# Patient Record
Sex: Female | Born: 1960
Health system: Western US, Academic
[De-identification: ages and names within clinical notes are randomized; demographics above are authoritative.]

---

## 2018-05-04 ENCOUNTER — Ambulatory Visit

## 2018-05-04 DIAGNOSIS — M5127 Other intervertebral disc displacement, lumbosacral region: Secondary | ICD-10-CM

## 2018-05-04 DIAGNOSIS — M5116 Intervertebral disc disorders with radiculopathy, lumbar region: Secondary | ICD-10-CM

## 2018-05-04 DIAGNOSIS — M9933 Osseous stenosis of neural canal of lumbar region: Secondary | ICD-10-CM

## 2018-05-04 DIAGNOSIS — M4726 Other spondylosis with radiculopathy, lumbar region: Secondary | ICD-10-CM

## 2018-05-04 NOTE — Progress Notes
Donnelly Stager, MD.  Donne Hazel, PA    The patient was seen in the [place] office.    The patient's office visit was accompanied and assisted by a Spanish-speaking interpreter.        SUBJECTIVE COMPLAINTS:  Vicki Cortez is a 57 year old female who returns to the office today for routine follow-up and reevaluation of her low back pain radiating to bilateral groins secondary to known, established mild lumbar spondylosis at L5-S1.     She returns to the office today reporting significant improvement in her condition in the interim since her last office visit.  She contributes this to chiropractic care in decompression.  She reports that her pain has decreased from a 7/10 at her last visit to now a 2/10.  She continues the cyclobenzaprine as needed.  Overall her condition is much improved.  She continues to have occasional lower lumbar soreness but no more debilitating pain.  She no longer has any radicular complaints of leg pain, numbness, tingling  or weakness.    PHYSICAL EXAMINATION:  GENERAL: She is a well-nourished, well-developed female.  She is alert and oriented. Standing upright posture is with shoulder and pelvis level. Respirations are normal.  Gait is normal.    MUSCULOSKELETAL/LUMBAR SPINE: Lumbar integumentary is warm, dry and pink without any erythema, ecchymosis or edema.  No tenderness is elicited on exam today.  Sitting straight leg raise is negative bilaterally. Bilateral lower extremity integumentary is warm, dry and pink without any erythema, ecchymosis, edema, atrophy or fasciculations noted. There are no motor or sensory deficits upon exam today.    RADIOGRAPHS: No new plain radiographs were taken in office today.    DIAGNOSIS:  1. Axial lumbar back pain  2. Occasional bilateral lumbar radiculitis  3. Central protrusion L3-4   4. 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 Mild lumbar spondylosis L5-S1, moderate spondylosis with osseous stenosis at L4-5 RECOMMENDATIONS: At this time will continue with observation.      FOLLOWUP:  We will see the patient in 2-3 months time for reevaluation if needed.  All of the patient's questions and concerns were addressed and answered.    Sincerely,    The patient was examined and evaluated by Donne Hazel, PA for Donnelly Stager, MD.  The evaluation and plan was reviewed and approved by Donnelly Stager, MD    Donne Hazel, PA-C    Donnelly Stager, M.D.  Orthopedic Surgeon        cc: Dr. Macarthur Critchley, MD

## 2018-05-18 ENCOUNTER — Ambulatory Visit

## 2018-05-19 MED ORDER — CELECOXIB 200 MG PO CAPS
200 mg | ORAL_CAPSULE | Freq: Every day | ORAL | 3 refills | Status: AC
Start: 2018-05-19 — End: ?

## 2018-07-10 ENCOUNTER — Ambulatory Visit: Payer: BLUE CROSS/BLUE SHIELD

## 2018-09-21 ENCOUNTER — Ambulatory Visit: Payer: BLUE CROSS/BLUE SHIELD

## 2018-09-21 NOTE — Progress Notes
DATE: 09/21/2018   NAME: Vicki Cortez   MRN: [id number]   DOB: January 10, 1961   AGE: 58 y.o.       Donnelly Stager, M.D.   Donne Hazel, PA-C.       The patient was seen at the [facility] office.    The patient's office visit was accompanied and assisted by a Spanish-speaking interpreter.     INTERIM HISTORY: Vicki Cortez returns for a follow up and reevaluation of her low back pain radiating to bilateral groins secondary to known, established mild lumbar spondylosis at L5-S1.       PHYSICAL EXAMINATION:    GENERAL: Alert, oriented, in no acute distress     PSYCH: Normal affect and mood    EENT: External exam of the eyes, ears, nose and mouth reveals no deformities, scars or lesions    PULMONARY: Respirations are regular and unlabored. Respiratory effort is normal with no evidence of intercostal retraction, or excessive use of accessory muscles    MUSCULOSKELETAL/LUMBAR: Standing upright posture is with shoulders and pelvis level. Lumbar integumentary is warm, dry and pink without any erythema, ecchymosis or edema. Range of motion: Forward flexion and lateral side bending is without reciprocation of pain. Toe and heal walking is performable without any weakness or reciprocation of leg or back pain.  No tenderness is elicited upon palpation.  Sitting straight leg raise is negative to bilateral lower extremities.  There are no motor or sensory deficits upon exam today. Trendelenburg test is negative.      Gait upon entrance and exit of the exam room is normal without a limp and unassisted by any ambulatory aid.      RADIOGRAPHS: No new radiographic studies were obtained today. Previous images were reviewed with the patient.      DIAGNOSIS:   1. Axial lumbar back pain  2. Occasional bilateral lumbar radiculitis  3. Central protrusion L3-4   4. 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 Mild lumbar spondylosis L5-S1, moderate spondylosis with osseous stenosis at L4-5 RECOMMENDATIONS: An extensive amount of time was taken discussing diagnosis and treatment options with the patient.      All of the patient???s questions and concerns were addressed and answered.      Follow up: The patient will follow up ***       Sincerely,     The patient was examined and evaluated by Donne Hazel, PA for Donnelly Stager, MD.  The evaluation and plan was reviewed and approved by Donnelly Stager, MD    Donne Hazel, PA-C     Donnelly Stager, M.D.  Orthopedic Surgeon         Cc: Melene Muller., MD

## 2018-11-02 ENCOUNTER — Ambulatory Visit: Payer: BLUE CROSS/BLUE SHIELD

## 2018-11-02 DIAGNOSIS — M4726 Other spondylosis with radiculopathy, lumbar region: Secondary | ICD-10-CM

## 2018-11-02 DIAGNOSIS — M5116 Intervertebral disc disorders with radiculopathy, lumbar region: Secondary | ICD-10-CM

## 2018-11-02 DIAGNOSIS — M4316 Spondylolisthesis, lumbar region: Secondary | ICD-10-CM

## 2018-11-02 DIAGNOSIS — M545 Low back pain: Secondary | ICD-10-CM

## 2018-11-02 MED ORDER — CELECOXIB 200 MG PO CAPS
200 mg | ORAL_CAPSULE | Freq: Every day | ORAL | 0 refills | Status: AC
Start: 2018-11-02 — End: 2019-02-16

## 2018-11-02 NOTE — Progress Notes
Donnelly Stager, MD.  Donne Hazel, PA  ???  The patient was seen in the [place] office.  ???  The patient's office visit was accompanied and assisted by a Spanish-speaking interpreter.                SUBJECTIVE COMPLAINTS:  Vicki Cortez is a 58 year old female who returns to the office today for routine follow-up and reevaluation of her low back pain radiating to bilateral groins secondary to known, established mild lumbar spondylosis at L5-S1.   ???  She returns to the office today reporting occasional soreness midline at the level of her iliac crest and inferior to the hips and buttocks.  She otherwise is without complaints and reports that for the most part her condition is manageable and tolerable.  She denies any further distal radicular complaints of leg pain, numbness, tingling weakness.  ???  PHYSICAL EXAMINATION:  GENERAL: She is a well-nourished, well-developed female.  She is alert and oriented. Standing upright posture is with shoulder and pelvis level. Respirations are normal.  Gait is normal.  ???  MUSCULOSKELETAL/LUMBAR SPINE: Lumbar integumentary is warm, dry and pink without any erythema, ecchymosis or edema.  No tenderness is elicited on exam today.  Sitting straight leg raise is negative bilaterally. Bilateral lower extremity integumentary is warm, dry and pink without any erythema, ecchymosis, edema, atrophy or fasciculations noted. There are no motor or sensory deficits upon exam today.  ???  RADIOGRAPHS: Today in office I ordered 2 views of the lumbar spine standing, which were taken, reviewed and interpreted by myself from an orthopedic standpoint in the office today with the patient and reviewed and compared with previous radiographs taken 13 months ago January 2019 revealing progression and worsening. There is normal alignment on AP and lateral view with normal lumbar lordotic contour. Disc heights are well maintained without collapse or narrowing. There is further progressed now grade 1 near grade 2 spondylolisthesis of L4 on L5.  There are level iliac crests with unremarkable sacroiliac and hip joints.    DIAGNOSIS:  1. Axial lumbar back pain  2. Occasional bilateral lumbar radiculitis  3. Central protrusion L3-4   4. 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 Mild lumbar spondylosis L5-S1, moderate spondylosis with osseous stenosis at L4-5  6. Further progressed near grade 2 spondylolisthesis of L4 on L5  ???  RECOMMENDATIONS: Radiographs are reviewed as stated above. At this time will continue with observation as her condition is manageable and tolerable.      Medical management is initiated in office today.  A prescription for celecoxib 200 mg is given in office today.  ???  FOLLOWUP:  We will see the patient on an as-needed basis per her request.  All of the patient's questions and concerns were addressed and answered.  ???  Sincerely,  ???  The patient was examined and evaluated by Donne Hazel, PA for Donnelly Stager, MD.  The evaluation and plan was reviewed and approved by Donnelly Stager, MD    Donne Hazel, PA-C  ???  Donnelly Stager, M.D.  Orthopedic Surgeon  ???  ???  ???  cc: Dr. Macarthur Critchley, MD

## 2018-12-11 NOTE — Progress Notes
DATE: 12/13/2018   NAME: Vicki Cortez   MRN: [id number]   DOB: 05-12-1961   AGE: 58 y.o.     TODD A. SHAPIRO M.D.    CHIEF COMPLAINT: Right shoulder     REFERRING PHYSICIAN: Melene Muller., MD    HISTORY PRESENT ILLNESS: The patient is a 58 y.o. female who presents to the office for an evaluation of her right shoulder. ***    The patient reports sharp pain in the right shoulder rated from a 5-7/10 in severity. Associated symptoms of swelling and popping are noted.    PREVIOUS MEDICAL HISTORY:   Past Medical History:   Diagnosis Date   ??? Asthma    ??? GERD (gastroesophageal reflux disease)        PREVIOUS SURGICAL HISTORY:   Past Surgical History:   Procedure Laterality Date   ??? CESAREAN SECTION     ??? GALLBLADDER SURGERY         MEDICATIONS:   Current Outpatient Medications:   ???  atenolol 25 mg tablet, TAKE 1/2 TAB ORAL EVERY DAY, Disp: , Rfl: 3  ???  atorvastatin 20 mg tablet, TAKE 1 TABLET (20 MG) BY MOUTH DAILY, Disp: , Rfl: 4  ???  pantoprazole 40 mg DR tablet, , Disp: , Rfl:   ???  celecoxib 200 mg capsule, Take 1 capsule (200 mg total) by mouth daily., Disp: 90 capsule, Rfl: 0  ???  cyclobenzaprine 5 mg tablet, TAKE 1 TABLET BY MOUTH EVERY 12 HOURS AS NEEDED, Disp: , Rfl: 2  ???  estradiol (ESTRACE) 0.1 mg/g vaginal cream, APPLY TO AFFECTED AREA AS DIRECTED, Disp: , Rfl: 0  ???  meloxicam 7.5 mg tablet,  See Instructions, 30 Tab, 5, TAKE 1 TABLET BY MOUTH TWICE A DAY, Soft Stop, Route to Pharmacy Electronically, [facility] [phone], Disp: , Rfl:   ???  omeprazole 20 mg DR capsule, TAKE ONE CAPSULE BY MOUTH TWICE A DAY, Disp: , Rfl: 3    ALLERGIES: No Known Allergies    SOCIAL HISTORY:   Social History     Tobacco Use   ??? Smoking status: Never Smoker   ??? Smokeless tobacco: Never Used   Substance Use Topics   ??? Alcohol use: No   ??? Drug use: No       FAMILY HISTORY:   Family History   Problem Relation Age of Onset   ??? Cancer Mother        REVIEW OF SYSTEMS: The 14-point review of systems as documented today in the medical record is remarkable for the positive orthopedic problems discussed above and their relevance was considered with respect to Constitutional, ENT, Cardiovascular, GU, Skin, Neurologic, Endocrine, Hematologic, Psychiatric, Gastrointestinal, Respiratory, Eyes and Allergic/Immunologic systems. Pertinent positives include: joint pain, joint stiffness, weakness of muscles or joints, back pain, cold extremities, bad general health, palpations, skin becoming drier, seasonal cough, and wears glasses.     EXAMINATION:  Ht 5' 3'' (1.6 m)  ~ Wt 136 lb (61.7 kg)  ~ BMI 24.09 kg/m???     GENERAL: Alert, oriented, in no acute distress     PSYCH: Normal affect and mood    EENT: External exam of the eyes, ears, nose and mouth reveals no deformities, scars or lesions    PULMONARY: Respirations are regular and unlabored. Respiratory effort is normal with no evidence of intercostal retraction, or excessive use of accessory muscles    MUSCULOSKELATAL:    Right Shoulder:  Inspection:  [x]  Normal   []  Surgical scars []   Traumatic scars []  Arthroscopic scars   []  Symmetric shoulders []  Asymmetric shoulders []  Biceps retracted   []  Swelling  []  Bruising []  AC joint deformity  []  Deltoid atrophy []  Supraspinatus fossa atrophy        []  Infraspinatus atrophy      Palpation: [x]   No tenderness []  Diffuse tenderness to palpation  []  AC Joint [] SC joint []  Subacromial space   []  Anterior shoulder []  Posterior shoulder        []  Biceps tendon   []  Trapezius   []  Scapula/Periscapular []  Warmth   []  Bogginess           []  Fluctuance                 []  AC joint ballottable        Range of motion exam:   Forward elevation  *** ? Pain []  Present [x]  Absent     Abduction *** ?  Pain []  Present [x]  Absent     External rotation at side *** ?  Pain []  Present [x]  Absent     Internal rotation  *** at spinal level  Pain []  Present [x]  Absent    Crepitus is         []  Present [x]  Absent    External rotation @ 90?:  Not tested. Internal rotation @ 90?: Not tested.  GIRD: Not tested.     Scapular Thoracic Motion:    [x]  Normal                      []  Medial winging                 []  Elevation with ROM   []  Asymmetric         Strength: []  Not tested secondary to pain  []  Not tested.   External rotation at side:  []  3/5 []  4-/5 [] 4/5 []  4+/5  []  5/5 []  with pain   Deltoid:  []  3/5 []  4-/5 [] 4/5 []  4+/5  []  5/5 []  with pain  Subscap (Belly Press): []  3/5 []  4-/5 [] 4/5 []  4+/5  []  5/5 []  with pain    []  Drop arm                 []  External Rotation Lag          Provocative Tests:   []  PROVOCATIVE NOT TESTED DUE TO PAIN    Impingement:  Neer [] Positive [] Negative  [x] Not Tested  Hawkins [] Positive [] Negative  [x] Not Tested  Cross arm Adduction [] Positive [] Negative  [x] Not Tested   Speeds    [] Positive [] Negative  [x] Not Tested     Instability/Labrum:  DLS    [] Positive [] Negative  [x] Not Tested  Active compression [] Positive [] Negative  [x] Not Tested      Load shift: [] Ant  [] Post [] Positive [] Negative  [x] Not Tested   Posterior stress [] Positive [] Negative  [x] Not Tested  Sulcus    [] Positive [] Negative  [x] Not Tested   Apprehension/Relocation [] Positive [] Negative  [x] Not Tested  Anterior/posterior drawer:  []  1+ []  2+ []  3+ []  Not Tested      NEUROLOGIC: No gross motor or sensory deficits     VASCULAR: Radial pulses are intact    SKIN: No rashes or lesions    X-Rays: Radiographs of the right shoulder 4 views, including AP, axillary outlet and acromioclavicular joint views were ordered, performed and reviewed in clinic today. They show ***    MRI: MRI of the  right shoulder without contrast performed at [facility] on April 09, 2017 was reviewed in clinic today. Study shows:      IMPRESSION: ***    MEDICAL DECISION MAKING: An extensive amount of time was spent reviewing radiographs, discussing diagnosis and treatment options with the patient.      All of the patient???s questions and concerns were addressed. FOLLOW UP: The patient will follow up ***       Sincerely,       _____________________   Shawnie Dapper. Nile Riggs, M.D.  Sports Medicine/Shoulder and Elbow Reconstruction  Orthopedic Surgery      Cc: Melene Muller., MD

## 2018-12-13 ENCOUNTER — Ambulatory Visit: Payer: BLUE CROSS/BLUE SHIELD | Attending: Sports Medicine

## 2018-12-13 DIAGNOSIS — M25511 Pain in right shoulder: Secondary | ICD-10-CM

## 2018-12-13 DIAGNOSIS — M75121 Complete rotator cuff tear or rupture of right shoulder, not specified as traumatic: Secondary | ICD-10-CM

## 2019-01-17 ENCOUNTER — Ambulatory Visit: Payer: BLUE CROSS/BLUE SHIELD | Attending: Sports Medicine

## 2019-01-17 DIAGNOSIS — M75121 Complete rotator cuff tear or rupture of right shoulder, not specified as traumatic: Secondary | ICD-10-CM

## 2019-01-17 NOTE — Progress Notes
DATE: 01/17/2019   NAME: Vicki Cortez   MRN: [id number]   DOB: [birth year]   AGE: 58 y.o.     TODD A. SHAPIRO, MD    Reason for visit: Follow up right shoulder.    INTERIM HISTORY:  The patient returns today for follow up and re-evaluation of her right shoulder.  She has completed an MRI which we will review in the office today.    ROS:  A COVID-19 questionnaire was filled out by the patient including a positive COVID-19 diagnosis in the last 14 days, contact with anybody diagnosed with  COVID-19 in the last 14 days, fever, headaches, muscle pain, weakness, and diarrhea nausea vomiting abdominal pain, respiratory illness/cough, shortness of breath, loss of smell, loss of taste, rash skin irritation, unexplained hemorrhage, and fatigue. The responses were negative. Temperature was taken and it was less than 100 F.      PHYSICAL EXAMINATION:   General Exam: The patient is awake, alert, no apparent distress.  Psych Exam: the patient has a normal mood and affect.    MUSCULOSKELATAL:    Right Shoulder:  Inspection:  [x]  Normal   []  Surgical scars []  Traumatic scars []  Arthroscopic scars   []  Symmetric shoulders []  Asymmetric shoulders []  Biceps retracted   []  Swelling  []  Bruising []  AC joint deformity  []  Deltoid atrophy []  Supraspinatus fossa atrophy        []  Infraspinatus atrophy      Palpation: [x]   No tenderness []  Diffuse tenderness to palpation  []  AC Joint [] SC joint []  Subacromial space   []  Anterior shoulder []  Posterior shoulder        []  Biceps tendon   []  Trapezius   []  Scapula/Periscapular []  Warmth   []  Bogginess           []  Fluctuance                 []  AC joint ballottable      Range of motion exam:   Forward elevation  *** ? Pain []  Present [x]  Absent     Abduction *** ?  Pain []  Present [x]  Absent     External rotation at side *** ?  Pain []  Present [x]  Absent     Internal rotation  *** at spinal level  Pain []  Present [x]  Absent    Crepitus is         []  Present [x]  Absent External rotation @ 90?:  Not tested.   Internal rotation @ 90?: Not tested.  GIRD: Not tested.     Scapular Thoracic Motion:    [x]  Normal                      []  Medial winging                 []  Elevation with ROM   []  Asymmetric         Strength: []  Not tested secondary to pain  []  Not tested.   External rotation at side:  []  3/5 []  4-/5 [] 4/5 []  4+/5  []  5/5 []  with pain   Deltoid:  []  3/5 []  4-/5 [] 4/5 []  4+/5  []  5/5 []  with pain  Subscap (Belly Press): []  3/5 []  4-/5 [] 4/5 []  4+/5  []  5/5 []  with pain    []  Drop arm                 []  External Rotation Lag  Provocative Tests:   []  PROVOCATIVE NOT TESTED DUE TO PAIN    Impingement:  Neer [] Positive [] Negative  [x] Not Tested  Hawkins [] Positive [] Negative  [x] Not Tested  Cross arm Adduction [] Positive [] Negative  [x] Not Tested   Speeds    [] Positive [] Negative  [x] Not Tested     Instability/Labrum:  DLS:    [] Positive [] Negative  [x] Not Tested     Active compression [] Positive [] Negative  [x] Not Tested      Load shift: [] Ant  [] Post [] Positive [] Negative  [x] Not Tested   Posterior stress [] Positive [] Negative  [x] Not Tested  Sulcus    [] Positive [] Negative  [x] Not Tested  Apprehension/Relocation [] Positive [] Negative  [x] Not Tested  Anterior/posterior drawer:  []  1+ []  2+ []  3+ []  Not Tested      Neurologic exam reveals no gross motor or sensory deficits.    Vascular exam reveals an easily palpable radial pulse and a well perfused extremity.    MRI      IMPRESSION: ***       PLAN: An extensive amount of time was spent discussing diagnosis and treatment options with the patient.     ***      All of the patient???s questions and concerns were addressed.    FOLLOW UP: The patient will follow up ***       Sincerely,       _____________________   Shawnie Dapper. Nile Riggs, M.D.  Sports Medicine/Shoulder and Elbow Reconstruction  Orthopedic Surgery      CC: Melene Muller., MD

## 2019-02-15 MED ORDER — CELECOXIB 200 MG PO CAPS
ORAL_CAPSULE | 0 refills | Status: AC
Start: 2019-02-15 — End: 2019-05-11

## 2019-04-12 NOTE — Progress Notes
DATE: 04/18/2019   NAME: Vicki Cortez   MRN: [id number]   DOB: [birth year]   AGE: 58 y.o.     Jamion Carter A. Ona Rathert, MD    Reason for visit: Follow up right shoulder.    INTERIM HISTORY:  The patient returns today for follow up and re-evaluation of her right shoulder. We are currently treating her conservatively for right shoulder full-thickness rotator cuff tendon tear is the patient has elected to treat her injury non operatively.  She reports that the shoulder feels better she has no issues.    ROS: A COVID-19 questionnaire was filled out by the patient prior to the visit that included questions about having had a positive COVID-19 diagnosis in the last 14 days, contact with anybody diagnosed with  COVID-19 in the last 14 days, fever, headaches, unexplained muscle pain, weakness, diarrhea, nausea, vomiting, abdominal pain, respiratory illness/cough, shortness of breath, loss of smell, loss of taste, rash, skin irritation, unexplained hemorrhage and fatigue. The responses to those questions were negative. A temperature was taken and it was less than 100 F.    PHYSICAL EXAMINATION:   General Exam: The patient is awake, alert, no apparent distress.  Psych Exam: the patient has a normal mood and affect.    MUSCULOSKELATAL:    Right Shoulder:  Inspection:  [x]  Normal   []  Surgical scars []  Traumatic scars []  Arthroscopic scars   []  Symmetric shoulders []  Asymmetric shoulders []  Biceps retracted   []  Swelling  []  Bruising []  AC joint deformity  []  Deltoid atrophy []  Supraspinatus fossa atrophy        []  Infraspinatus atrophy      Palpation: [x]   No tenderness []  Diffuse tenderness to palpation  []  AC Joint [] SC joint []  Subacromial space   []  Anterior shoulder []  Posterior shoulder        []  Biceps tendon   []  Trapezius   []  Scapula/Periscapular []  Warmth   []  Bogginess           []  Fluctuance                 []  AC joint ballottable      Range of motion exam:   Forward elevation  160 ? Pain []  Present [x]  Absent Abduction 160 ?  Pain []  Present [x]  Absent     External rotation at side 60 ?  Pain []  Present [x]  Absent     Internal rotation  T10 at spinal level  Pain []  Present [x]  Absent    Crepitus is         []  Present [x]  Absent    External rotation @ 90?:  Not tested.   Internal rotation @ 90?: Not tested.  GIRD: Not tested.     Scapular Thoracic Motion:    [x]  Normal                      []  Medial winging                 []  Elevation with ROM   []  Asymmetric         Strength: []  Not tested secondary to pain  []  Not tested.   External rotation at side:  []  3/5 []  4-/5 [x] 4/5 []  4+/5  []  5/5 []  with pain   Deltoid:  []  3/5 []  4-/5 [] 4/5 []  4+/5  []  5/5 []  with pain  Subscap (Belly Press): []  3/5 []  4-/5 [] 4/5 []  4+/5  [x]  5/5 []  with  pain    []  Drop arm                 []  External Rotation Lag          Provocative Tests:   []  PROVOCATIVE NOT TESTED DUE TO PAIN    Impingement:  Neer [] Positive [] Negative  [x] Not Tested  Hawkins [] Positive [] Negative  [x] Not Tested  Cross arm Adduction [] Positive [] Negative  [x] Not Tested   Speeds    [] Positive [] Negative  [x] Not Tested     Instability/Labrum:  DLS:    [] Positive [] Negative  [x] Not Tested     Active compression [] Positive [] Negative  [x] Not Tested      Load shift: [] Ant  [] Post [] Positive [] Negative  [x] Not Tested   Posterior stress [] Positive [] Negative  [x] Not Tested  Sulcus    [] Positive [] Negative  [x] Not Tested  Apprehension/Relocation [] Positive [] Negative  [x] Not Tested  Anterior/posterior drawer:  []  1+ []  2+ []  3+ []  Not Tested      Neurologic exam reveals no gross motor or sensory deficits.    Vascular exam reveals an easily palpable radial pulse and a well perfused extremity.    IMPRESSION: Right shoulder full-thickness rotator cuff tendon tear       PLAN:   She continues to have minimal symptoms from her right shoulder rotator cuff tendon tear.  She has a fairly large rotator cuff tendon tear.  My recommendation would be for rotator cuff repair despite having minimal symptoms in the shoulder as the natural history is that the tear will progress in size and become a irreparable.  At that point on the salvage procedures could be undertaken including superior capsular reconstruction or reverse shoulder replacement.      All of the patient???s questions and concerns were addressed.    FOLLOW UP: The patient will follow up prn       Sincerely,       _____________________   Shawnie Dapper. Nile Riggs, M.D.  Sports Medicine/Shoulder and Elbow Reconstruction  Orthopedic Surgery      CC: Melene Muller., MD

## 2019-04-18 ENCOUNTER — Ambulatory Visit: Payer: BLUE CROSS/BLUE SHIELD | Attending: Sports Medicine

## 2019-04-18 DIAGNOSIS — M75121 Complete rotator cuff tear or rupture of right shoulder, not specified as traumatic: Secondary | ICD-10-CM

## 2019-05-11 MED ORDER — CELECOXIB 200 MG PO CAPS
ORAL_CAPSULE | 2 refills | Status: AC
Start: 2019-05-11 — End: ?

## 2019-07-12 ENCOUNTER — Ambulatory Visit: Payer: BLUE CROSS/BLUE SHIELD

## 2019-07-12 DIAGNOSIS — M9933 Osseous stenosis of neural canal of lumbar region: Secondary | ICD-10-CM

## 2019-07-12 DIAGNOSIS — M4726 Other spondylosis with radiculopathy, lumbar region: Secondary | ICD-10-CM

## 2019-07-12 DIAGNOSIS — M4316 Spondylolisthesis, lumbar region: Secondary | ICD-10-CM

## 2019-07-12 DIAGNOSIS — R52 Pain, unspecified: Secondary | ICD-10-CM

## 2019-07-12 NOTE — Progress Notes
Donnelly Stager, MD.  Donne Hazel, PA  ?  The patient was seen in the [place] office.  ?  The patient's office visit was accompanied and assisted by a Spanish-speaking interpreter.??  ?  SUBJECTIVE COMPLAINTS: ?Mrs. Vicki Cortez is a 40?year old female who returns to the office today for routine follow-up and reevaluation of her low back pain radiating to bilateral groins secondary to known, established mild lumbar spondylosis at L5-S1.   ?  She returns to the office today reporting occasional soreness midline at the level of her iliac crest and inferior to the hips and buttocks.  She otherwise is without complaints and reports that for the most part her condition is manageable and tolerable.  She denies any further distal radicular complaints of leg pain, numbness, tingling weakness.  ?  PHYSICAL EXAMINATION:  GENERAL: She is a well-nourished, well-developed female. ?She is alert and oriented. Standing upright posture is with shoulder and pelvis level. Respirations are normal. ?Gait is normal.  ?  MUSCULOSKELETAL/LUMBAR SPINE: Lumbar integumentary is warm, dry and pink without any erythema, ecchymosis or edema.??No tenderness is elicited on exam today. ?Sitting straight leg raise is negative bilaterally. Bilateral lower extremity integumentary is warm, dry and pink without any erythema, ecchymosis, edema, atrophy or fasciculations noted. There are no motor or sensory deficits upon exam today.  ?  RADIOGRAPHS: Today in office I ordered 2 views of the lumbar spine standing, which were taken, reviewed and interpreted by myself from an orthopedic standpoint in the office today with the patient and reviewed and compared with previous radiographs taken 13 months ago January 2019 revealing progression and worsening. There is normal alignment on AP and lateral view with normal lumbar lordotic contour. Disc heights are well maintained without collapse or narrowing. There is further progressed now grade 1 near grade 2 spondylolisthesis of L4 on L5.  There are level iliac crests with unremarkable sacroiliac and hip joints.  ?  DIAGNOSIS:  1. Axial lumbar back pain  2. Occasional bilateral lumbar radiculitis  3. Central protrusion L3-4   4. 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 Mild lumbar spondylosis L5-S1, moderate spondylosis with osseous stenosis at L4-5  6. Further progressed near grade 2 spondylolisthesis of L4 on L5  ?  RECOMMENDATIONS:?Radiographs are reviewed as stated above. At?this time will continue with observation as her condition is manageable and tolerable. ?  ?  Medical management is initiated in office today.  A prescription for celecoxib 200 mg is given in office today.  ?  FOLLOWUP:??We will see the patient on an as-needed basis per her request. ?All of the patient's questions and concerns were addressed and answered.  ?  Sincerely,  ?  The patient was examined and evaluated by Donne Hazel, PA for Donnelly Stager, MD. ?The evaluation and plan was reviewed and approved by Donnelly Stager, MD    Donne Hazel, PA-C  ?  Donnelly Stager, M.D.  Orthopedic Surgeon  ?  ?  ?  cc: Dr. Macarthur Critchley, MD

## 2019-07-12 NOTE — Patient Instructions
[facility] after [facility]

## 2019-08-13 ENCOUNTER — Ambulatory Visit: Payer: BLUE CROSS/BLUE SHIELD

## 2019-09-27 ENCOUNTER — Ambulatory Visit: Payer: BLUE CROSS/BLUE SHIELD

## 2019-09-27 DIAGNOSIS — M9933 Osseous stenosis of neural canal of lumbar region: Secondary | ICD-10-CM

## 2019-09-27 DIAGNOSIS — R52 Pain, unspecified: Secondary | ICD-10-CM

## 2019-09-27 DIAGNOSIS — M4726 Other spondylosis with radiculopathy, lumbar region: Secondary | ICD-10-CM

## 2019-09-27 DIAGNOSIS — M4316 Spondylolisthesis, lumbar region: Secondary | ICD-10-CM

## 2019-09-27 NOTE — Progress Notes
DATE: 09/27/2019   NAME: Vicki Cortez   MRN: [id number]   DOB: 1961/05/31   AGE: 59 y.o.   ?  ?  Donnelly Stager, MD.  Donne Hazel, PA  ?  The patient was seen in the [place] office.  ?  SUBJECTIVE COMPLAINTS: ?Vicki Cortez is a 59?year old female who returns to the office today for routine follow-up and reevaluation of her low back pain radiating to bilateral groins secondary to known, established spondylolisthesis of L4 on L5 with lumbar disc herniation.   ?  She returns to the office today?reporting  no change or improvement in her condition in the interim since her last office visit.  She reports  this is despite administration of a diagnostic and therapeutic epidural steroid injection at L4-5 about 1 month ago by pain management specialist Dr. Lurena Nida.  She reports tolerating the injection well denying any fevers, chills or headaches.  She reports the injection did not help at all not even day.  She continues to localize pain midline at the level of her iliac crest and inferior to the hips and buttocks. ?She reports leg pain, numbness and tingling radiating to the right greater than left L5 dermatome.  Her complaints are a 6+/10 in severity.  She reports a followup with Dr. Jenne Campus early next week.    ROS:  A COVID-19 questionnaire was filled out by the patient including contact with anybody diagnosed with COVID-19 in the last 30 days, fever, headaches, muscle pain, weakness, and diarrhea nausea vomiting abdominal pain, respiratory illness/cough, shortness of breath, loss of smell, loss of taste, rash skin irritation, unexplained hemorrhage, and fatigue.  Responses were negative.  ?  PHYSICAL EXAMINATION:  VITAL SIGNS: The patient's temperature was taken twice upon entrance to the office building today given COVID-19 screening; the patient is afebrile. GENERAL: She is a well-nourished, well-developed female. ?She is alert and oriented. Standing upright posture is with shoulder and pelvis level. Respirations are normal. ?Gait is normal.  ?  MUSCULOSKELETAL/LUMBAR SPINE: Lumbar integumentary is warm, dry and pink without any erythema, ecchymosis or edema.??Tenderness is elicited on exam today at the belt line and inferior to the lumbosacral junctions. ?Sitting straight leg raise is negative bilaterally. Bilateral lower extremity integumentary is warm, dry and pink without any erythema, ecchymosis, edema, atrophy or fasciculations noted. There are no motor or sensory deficits upon exam today.  ?  RADIOGRAPHS:?No new radiographs were taken in office today.  ?  DIAGNOSIS:  1. Axial lumbar back pain  2. Occasional bilateral lumbar radiculitis  3. Central protrusion L3-4   4. Broad-based disc herniation L4-5 with annular tear?  5. Mild?lumbar spondylosis L5-S1, moderate spondylosis with osseous stenosis at L4-5  6. Further progressed near grade 2 spondylolisthesis of L4 on L5  ?  RECOMMENDATIONS:? At this time will proceed with observation.  She will follow-up as scheduled early next week per her request with pain management specialist.    It is discussed of a 2nd diagnostic and therapeutic epidural steroid injection.  She wishes to consider this treatment option.  ?  FOLLOWUP: We will see the patient per her request after she sees her pain management specialist next week or is reassured to telephone our office as far as proceeding with the next step of her care.  All of the patient's questions and concerns were addressed and answered.  ?  ?  Sincerely,  ?  The patient was examined and evaluated by Donne Hazel, PA for Donnelly Stager, MD. ?The evaluation  and plan was reviewed and approved by Donnelly Stager, MD    Donne Hazel, PA-C  ?  Donnelly Stager, M.D.  Orthopedic Surgeon  ?  ?  ?  cc: Dr. Macarthur Critchley, MD      Dr. Lurena Nida, MD

## 2019-12-24 ENCOUNTER — Ambulatory Visit: Payer: BLUE CROSS/BLUE SHIELD

## 2019-12-24 DIAGNOSIS — Z23 Encounter for immunization: Secondary | ICD-10-CM

## 2021-10-21 ENCOUNTER — Ambulatory Visit: Payer: BLUE CROSS/BLUE SHIELD | Attending: Sports Medicine

## 2021-11-11 ENCOUNTER — Ambulatory Visit: Payer: BLUE CROSS/BLUE SHIELD | Attending: Sports Medicine

## 2021-11-11 DIAGNOSIS — M75121 Complete rotator cuff tear or rupture of right shoulder, not specified as traumatic: Secondary | ICD-10-CM

## 2021-11-11 DIAGNOSIS — M25511 Pain in right shoulder: Secondary | ICD-10-CM

## 2021-11-11 NOTE — Progress Notes
DATE: 11/11/2021   NAME: Vicki Cortez   MRN: [id number]   DOB: 1960/10/19   AGE: 61 y.o.       Rohen Kimes A. Nile Riggs, MD    CHIEF COMPLAINT:   Chief Complaint   Patient presents with   ? Right Shoulder - Pain     REFERRING PHYSICIAN: Melene Muller., MD    HISTORY PRESENT ILLNESS: Vicki Cortez is a 61 y.o. female who presents to the office for an evaluation of her right shoulder.  She was last seen in July 2022.  She was diagnosed with the rotator cuff tear the she elected to treat non operatively.  She returns today with complaints of right shoulder pain which have worsened.  She notes pain which radiates from the shoulder down the arm.  She has pain associated with sleeping and lying down in bed.    PREVIOUS MEDICAL HISTORY:   Past Medical History:   Diagnosis Date   ? Asthma    ? GERD (gastroesophageal reflux disease)    ? Hypercholesterolemia    ? Palpitations      PREVIOUS SURGICAL HISTORY:   Past Surgical History:   Procedure Laterality Date   ? CESAREAN SECTION     ? GALLBLADDER SURGERY       MEDICATIONS:   Current Outpatient Medications:   ?  atenolol 25 mg tablet, TAKE 1/2 TAB ORAL EVERY DAY, Disp: , Rfl: 3  ?  atorvastatin 20 mg tablet, TAKE 1 TABLET (20 MG) BY MOUTH DAILY, Disp: , Rfl: 4  ?  CELECOXIB 200 mg capsule, TAKE 1 CAPSULE BY MOUTH EVERY DAY, Disp: 30 capsule, Rfl: 2  ?  cyclobenzaprine 5 mg tablet, TAKE 1 TABLET BY MOUTH EVERY 12 HOURS AS NEEDED, Disp: , Rfl: 2  ?  estradiol (ESTRACE) 0.1 mg/g vaginal cream, APPLY TO AFFECTED AREA AS DIRECTED, Disp: , Rfl: 0  ?  meloxicam 7.5 mg tablet,  See Instructions, 30 Tab, 5, TAKE 1 TABLET BY MOUTH TWICE A DAY, Soft Stop, Route to Pharmacy Electronically, [facility] [phone], Disp: , Rfl:   ?  omeprazole 20 mg DR capsule, TAKE ONE CAPSULE BY MOUTH TWICE A DAY, Disp: , Rfl: 3  ?  pantoprazole 40 mg DR tablet, , Disp: , Rfl:     ALLERGIES: No Known Allergies    SOCIAL HISTORY:   Social History     Tobacco Use   ? Smoking status: Never   ? Smokeless tobacco: Never Substance Use Topics   ? Alcohol use: No   ? Drug use: No     FAMILY HISTORY:   Family History   Problem Relation Age of Onset   ? Cancer Mother      REVIEW OF SYSTEMS: The 14-point review of systems as documented today in the medical record is remarkable for the positive orthopedic problems discussed above and their relevance was considered with respect to Constitutional, ENT, Cardiovascular, GU, Skin, Neurologic, Endocrine, Hematologic, Psychiatric, Gastrointestinal, Respiratory, Eyes and Allergic/Immunologic systems.    Pertinent positives include: Back pain, Palpitations, Increased cholesterol, Frequent urination, 3 pregnancies and 0 deliveries.    EXAMINATION:  Ht 5' 3'' (1.6 m)  ~ Wt 140 lb (63.5 kg)  ~ BMI 24.80 kg/m?     GENERAL: Alert, oriented, in no acute distress     PSYCH: Normal affect and mood    EENT: External exam of the eyes, ears, nose and mouth reveals no deformities, scars or lesions    PULMONARY: Respirations are regular and unlabored. Respiratory  effort is normal with no evidence of intercostal retraction, or excessive use of accessory muscles    MUSCULOSKELETAL:    Right Shoulder:    Inspection:  [x]  Normal   []  Surgical scars []  Traumatic scars []  Arthroscopic scars   []  Symmetric shoulders []  Asymmetric shoulders []  Biceps retracted   []  Swelling  []  Bruising []  AC joint deformity  []  Deltoid atrophy []  Infraspinatus atrophy       []  Supraspinatus fossa atrophy      Palpation:  [x]   No tenderness []  Diffuse tenderness to palpation  []  AC Joint [] SC joint []  Subacromial space   []  Anterior shoulder []  Posterior shoulder []  Biceps tendon   []  Trapezius   []  Scapula/Periscapular []  Warmth   []  Bogginess           []  Fluctuance []  AC joint ballottable      Range of Motion:   Forward elevation: 160 ? Pain []  Present [x]  Absent     Abduction: 160 ?  Pain []  Present [x]  Absent     External rotation at side: 60 ?  Pain []  Present [x]  Absent     Internal rotation: T10 at spinal level  Pain []  Present [x]  Absent    Crepitus is         []  Present [x]  Absent    External rotation @ 90?:  Not tested.   Internal rotation @ 90?: Not tested.  GIRD: Not tested.     Scapular Thoracic Motion:    [x]  Normal                      []  Medial winging                 []  Elevation with ROM   []  Asymmetric         Strength:  []  Not tested secondary to pain  []  Not tested.   External rotation at side: []  3/5      []  4-/5      [x] 4/5      []  4+/5      []  5/5      []  with pain   Deltoid:  []  3/5      []  4-/5      [] 4/5      []  4+/5      [x]  5/5      []  with pain  Subscap (Belly Press): []  3/5      []  4-/5      [] 4/5      []  4+/5      [x]  5/5      []  with pain    []  Drop arm                 []  External Rotation Lag          Provocative Tests:   []  PROVOCATIVE NOT TESTED DUE TO PAIN    Impingement:  Neer [] Positive [] Negative  [x] Not Tested  Hawkins [] Positive [] Negative  [x] Not Tested  Cross arm Adduction [] Positive [] Negative  [x] Not Tested   Speeds    [] Positive [] Negative  [x] Not Tested     Instability/Labrum:  Active compression [] Positive [] Negative  [x] Not Tested      Load shift:  [] Ant  [] Post [] Positive [] Negative  [x] Not Tested   Posterior stress [] Positive [] Negative  [x] Not Tested  Sulcus    [] Positive [] Negative  [x] Not Tested  Apprehension/Relocation [] Positive [] Negative  [x] Not Tested  Anterior/posterior drawer:  []  1+ []   2+ []  3+ [x]  Not Tested    NEUROLOGIC: No gross motor or sensory deficits    VASCULAR: Radial pulses are intact    SKIN: No rashes or lesions    RADIOGRAPHS: Radiographs of the right shoulder, 4 views including AP, grashey, outlet, and axillary views were ordered, performed, and reviewed in clinic today. They show no fractures or dislocations.  Glenohumeral joint space is well maintained.    MRI: An MRI study of the right shoulder without contrast performed at [facility] on December 16, 2018 was reviewed in clinic today. Study shows:  1.  High-grade partial tear anterior infraspinatus insertion, may include full-thickness component, measures 1.0 x 0.8 cm with mild interstitial tearing extending medially. This also involves the posterior leading fibers of the  supraspinatus tendon. This is mildly progressed when compared to MR from 04/09/2017.  2.  Unchanged mild degenerative signal within the superior labrum.  3.  Moderate acromioclavicular degenerative joint disease with mild lateral downsloping of the distal acromion.  4.  Small joint effusion with a moderate amount of fluid extending into the subacromial/subdeltoid bursa.    IMPRESSION:  1. Right shoulder rotator cuff tendon tear    MEDICAL DECISION MAKING:  I reviewed the x-rays and examined the patient.  She has weakness consistent with rotator cuff tendon tearing.  My recommendation would be to get an updated MRI of the shoulder.  The patient is willing to consider surgery at this point.  All of the patient?s questions and concerns were addressed.     FOLLOW UP: The patient will follow up after the MR        Sincerely,       _____________________   Shawnie Dapper. Nile Riggs, M.D.  Sports Medicine/Shoulder and Elbow Reconstruction  Orthopedic Surgery        Cc: Melene Muller., MD

## 2021-12-31 ENCOUNTER — Ambulatory Visit: Payer: BLUE CROSS/BLUE SHIELD | Attending: Surgical

## 2021-12-31 DIAGNOSIS — M75121 Complete rotator cuff tear or rupture of right shoulder, not specified as traumatic: Secondary | ICD-10-CM

## 2021-12-31 NOTE — Progress Notes
DATE: 12/31/2021   NAME: Vicki Cortez   MRN: [id number]   DOB: 29-Apr-1961   AGE: 61 y.o.       REASON FOR VISIT: Follow-up right shoulder    INTERIM HISTORY: Vicki Cortez returns today for reevaluation of the right shoulder.  The patient has had right shoulder pain for several years.  Surgery was recommended in July 2020 for a high-grade rotator cuff tendon tear.  The patient elected to treat the shoulder non operatively at that time.  She notes worsening symptoms.  She has pain that radiates down the arm.  She has trouble sleeping and laying flat in bed.  She has completed an MRI and comes today for the results.     EXAMINATION:   There were no vitals taken for this visit.     GENERAL: Alert, oriented, in no acute distress     PSYCH: Normal affect and mood    EENT: External exam of the eyes, ears, nose and mouth reveals no deformities, scars or lesions    PULMONARY: Respirations are regular and unlabored. Respiratory effort is normal with no evidence of intercostal retraction, or excessive use of accessory muscles    MUSCULOSKELETAL:  Right Shoulder:  ?  Inspection:  [x] ? Normal   [] ? Surgical scars                [] ? Traumatic scars             [] ? Arthroscopic scars   [] ? Symmetric shoulders     [] ? Asymmetric shoulders   [] ? Biceps retracted   [] ? Swelling                         [] ? Bruising                          [] ? AC joint deformity  [] ? Deltoid atrophy              [] ? Infraspinatus atrophy       [] ? Supraspinatus fossa atrophy    ?  Palpation:     [] ?  No tenderness   [] ? Diffuse tenderness to palpation  [] ? AC Joint                         [] ?SC joint                           [x] ? Subacromial space   [] ? Anterior shoulder           [] ? Posterior shoulder         [] ? Biceps tendon   [] ? Trapezius                       [] ? Scapula/Periscapular     [] ? Warmth   [] ? Bogginess                      [] ? Fluctuance                     [] ? AC joint ballottable    ?  Range of Motion:   Forward elevation: 160 ? Pain     [] ? Present [x] ? Absent  Abduction: 160 ?                               Pain     [] ? Present [x] ? Absent                    External rotation at side: 60 ?           Pain     [] ? Present [x] ? Absent                              Internal rotation: T10 at spinal level             Pain           [] ? Present     [x] ? Absent  ?  Crepitus is         [] ? Present              [x] ? Absent  ?  External rotation @ 90?:  Not tested.   Internal rotation @ 90?: Not tested.  GIRD: Not tested.   ?  Scapular Thoracic Motion:    [x] ? Normal                      [] ? Medial winging                 [] ? Elevation with ROM   [] ? Asymmetric       ?  Strength:      [] ? Not tested secondary to pain  [] ? Not tested.   External rotation at side:     [] ? 3/5      [] ? 4-/5      [x] ?4/5      [] ? 4+/5      [] ? 5/5      [] ? with pain   Deltoid:                               [] ? 3/5      [] ? 4-/5      [] ?4/5      [] ? 4+/5      [x] ? 5/5      [] ? with pain  Subscap (Belly Press):       [] ? 3/5      [] ? 4-/5      [] ?4/5      [] ? 4+/5      [x] ? 5/5      [] ? with pain  ?  [] ? Drop arm                 [] ? External Rotation Lag        ?  Provocative Tests:   [] ? PROVOCATIVE NOT TESTED DUE TO PAIN    Impingement:  Neer                                    [] ?Positive     [] ?Negative    [x] ?Not Tested  Hawkins                              [] ?Positive     [] ?Negative    [x] ?Not Tested  Cross arm Adduction          [] ?  Positive     [] ?Negative    [x] ?Not Tested             Speeds                                [] ?Positive     [] ?Negative    [x] ?Not Tested             ?  Instability/Labrum:  Active compression            [] ?Positive     [] ?Negative    [x] ?Not Tested      Load shift:  [] ?Ant  [] ?Post  [] ?Positive     [] ?Negative    [x] ?Not Tested             Posterior stress                   [] ?Positive     [] ?Negative    [x] ?Not Tested  Sulcus                                 [] ?Positive     [] ?Negative    [x] ?Not Tested  Apprehension/Relocation   [] ?Positive     [] ?Negative    [x] ?Not Tested  Anterior/posterior drawer:   [] ? 1+  [] ? 2+  [] ? 3+  [x] ? Not Tested  ?  NEUROLOGIC: No gross motor or sensory deficits  ?  VASCULAR: Radial pulses are intact  ?  SKIN: No rashes or lesions    RADIOGRAPHS:  No new images taken today.    MRI of the right shoulder completed at [facility] on December 04, 2021:       IMPRESSION:   Right shoulder full-thickness rotator cuff tendon tear    PLAN:  We discussed the diagnosis and treatment options.  We reviewed the most recent MRI.  She does have a full-thickness rotator cuff tendon tear which has progressed in comparison to previous MRIs.  Surgery is recommended for arthroscopy and rotator cuff tendon repair.     I have discussed the diagnosis and proposed procedure with the patient.  In lay terms, I have provided them with all the information necessary to make an informed decision.  In particular we discussed proposed benefit of the operation, as well as the significant risks.  We also discussed the available alternative treatments and risks of these alternatives.  I informed them the level of the severity of the injury and the prognosis for the recovery.  We also discussed the proposed plan for rehabilitation.  They understand that additional surgery may be necessary to solve this problem if they consent to the procedure.    She will follow-up with her primary care physician and cardiologist for appropriate clearance.    FOLLOW UP: The patient will follow up at her pre-op appointment, sooner as needed        The patient was examined and evaluated by Weldon Picking, PA-C for Hope Budds M.D. The evaluation and plan was reviewed and approved by Hope Budds M.D.      _________________________________  Weldon Picking, PA-C  Hope Budds M.D.  ORTHOPEDIC SURGERY        CC: Melene Muller., MD

## 2022-02-10 ENCOUNTER — Ambulatory Visit: Payer: BLUE CROSS/BLUE SHIELD | Attending: Surgical

## 2022-02-10 DIAGNOSIS — M75121 Complete rotator cuff tear or rupture of right shoulder, not specified as traumatic: Secondary | ICD-10-CM

## 2022-02-10 DIAGNOSIS — M25511 Pain in right shoulder: Secondary | ICD-10-CM

## 2022-02-10 NOTE — Progress Notes
PRE-OPERATIVE HISTORY AND PHYSICAL    02/10/2022     Date of Surgery:  February 18, 2022        Patient:   Vicki Cortez   Acct No: [id number]  DOB:  1960-11-16      Planned Surgery:  Right shoulder operative arthroscopy-debridement, rotator cuff tendon repair and biceps tenodesis    HISTORY OF PRESENT ILLNESS: Vicki Cortez is a 61 y.o. female who comes for pre-operative evaluation of her right shoulder.  The patient has had right shoulder pain for several years.  Surgery was recommended in July 2020 for a high-grade partial-thickness rotator cuff tendon tear.  The patient elected to treat her symptoms conservatively as she was managing her pain well at the time.  Her pain has progressed recently.  She has pain that radiates down the arm.  She has limitations with reaching and lifting and trouble sleeping.  She has elected to proceed with surgery due to persistent pain.    PREVIOUS MEDICAL HISTORY:   Past Medical History:   Diagnosis Date   ? Asthma    ? GERD (gastroesophageal reflux disease)    ? Hypercholesterolemia    ? Palpitations        PREVIOUS SURGICAL HISTORY:   Past Surgical History:   Procedure Laterality Date   ? CESAREAN SECTION     ? GALLBLADDER SURGERY         MEDICATIONS:   Current Outpatient Medications:   ?  omeprazole 20 mg DR capsule,  1 Cap, PO, BID, Qty: 60 Cap, Refills: 3, Maintenance, Route to Pharmacy Electronically, [facility] [phone], Disp: , Rfl:   ?  atenolol 25 mg tablet, TAKE 1/2 TAB ORAL EVERY DAY, Disp: , Rfl: 3  ?  atorvastatin 20 mg tablet, TAKE 1 TABLET (20 MG) BY MOUTH DAILY, Disp: , Rfl: 4     ALLERGIES: No Known Allergies    SOCIAL HISTORY:   Social History     Tobacco Use   ? Smoking status: Never   ? Smokeless tobacco: Never   Substance Use Topics   ? Alcohol use: No   ? Drug use: No       FAMILY HISTORY:   Family History   Problem Relation Age of Onset   ? Cancer Mother        The patient's medical history, medications, and pertinent information have been reviewed and updated in the patient's electronic medical record.    REVIEW OF SYSTEMS: Positive for musculoskeletal. Other systems have been updated and reviewed in the patient's electronic medical record.      PHYSICAL EXAM: BP 110/64  ~ Pulse 63  ~ Ht 5' 3'' (1.6 m)  ~ Wt 140 lb (63.5 kg)  ~ SpO2 93%  ~ BMI 24.80 kg/m?     The patient is well developed, well nourished, and in no acute distress.  Body habitus is normal.    Patient is oriented x 3, to place, time and person. Mood is normal. Affect is normal.      External exam of eyes, ears, nose and mouth reveals no deformities, scars or lesions.      Respirations are regular and unlabored.  Respiratory effort is normal with no evidence of abnormal intercostal retraction, or excessive use of accessory muscles.      External chest is normal in appearance.      Skin appears to be normal without rashes, lesions, or ulcers.      Pulse is regular.  No cyanosis, clubbing or  edema is evident.      Patient has no movement disorder or loss of sensation except as described below.      Gait and station are within normal limits except as described below.  No amputations or fixed deformities are evident.      MUSCULOSKELETAL:   Right Shoulder:  ?  Inspection: ?[x] ???Normal   [] ???Surgical scars????????????????[] ???Traumatic scars?????????????[] ???Arthroscopic scars   [] ???Symmetric shoulders?????[] ???Asymmetric shoulders???[] ???Biceps retracted   [] ???Swelling ????????????????????????[] ???Bruising??????????????????????????[] ???AC joint deformity  [] ???Deltoid atrophy??????????????[] ???Infraspinatus atrophy ????  [] ???Supraspinatus fossa atrophy ?  ?  Palpation:?????[] ????No tenderness???[] ???Diffuse tenderness to palpation  [] ???AC Joint?????????????????????????[] ??SC joint???????????????????????????[x] ???Subacromial space   [] ???Anterior shoulder???????????[] ???Posterior shoulder?????????[] ???Biceps tendon   [] ???Trapezius ??????????????????????[] ???Scapula/Periscapular?????[] ???Warmth   [] ???Bogginess ?????????????????????[] ???Fluctuance?????????????????????[] ???AC joint ballottable ?  ?  Range of Motion:   Forward elevation:?160????????????????????Pain?????[] ???Present?[x] ???Absent????????????????????????????  Abduction:?160?????????????????????????????????Pain?????[] ???Present?[x] ???Absent??????????????????  External rotation at side:?60?????????????Pain?????[] ???Present?[x] ???Absent????????????????????????????  Internal rotation:?T10?at spinal level ????????????Pain???????????[] ???Present?????[x] ???Absent  ?  Crepitus is ????????[] ???Present??????????????[x] ???Absent  ?  External rotation @ 90?: ?Not tested.   Internal rotation @ 90?: Not tested.  GIRD: Not tested.   ?  Scapular Thoracic Motion:??  [x] ???Normal ?????????????????????[] ???Medial winging ????????????????[] ???Elevation with ROM   [] ???Asymmetric ????  ?  Strength:??????[] ???Not tested secondary to pain ?[] ???Not tested.   External rotation at side:?????[] ???3/5??????[] ???4-/5 ?????[x] ??4/5 ?????[] ???4+/5 ?????[] ???5/5 ?????[] ???with pain   Deltoid: ??????????????????????????????[] ???3/5??????[] ???4-/5 ?????[] ??4/5 ?????[] ???4+/5 ?????[x] ???5/5 ?????[] ???with pain  Subscap (Belly Press):???????[] ???3/5??????[] ???4-/5 ?????[] ??4/5 ?????[] ???4+/5 ?????[x] ???5/5 ?????[] ???with pain  ?  [] ???Drop arm ????????????????[] ???External Rotation Lag ?????  ?  Provocative Tests:   [] ???PROVOCATIVE NOT TESTED DUE TO PAIN??  Impingement:  Neer????????????????????????????????????[] ??Positive?????[] ??Negative ???[x] ??Not Tested  Hawkins??????????????????????????????[] ??Positive?????[] ??Negative ???[x] ??Not Tested  Cross arm Adduction??????????[] ??Positive?????[] ??Negative ???[x] ??Not Tested???????????  Speeds ???????????????????????????????[] ??Positive?????[] ??Negative ???[x] ??Not Tested???????????  ?  Instability/Labrum:  Active compression????????????[] ??Positive?????[] ??Negative ???[x] ??Not Tested ???  Load shift: ?[] ??Ant ?[] ??Post??[] ??Positive?????[] ??Negative ???[x] ??Not Tested???????????  Posterior stress???????????????????[] ??Positive?????[] ??Negative ???[x] ??Not Tested  Sulcus ????????????????????????????????[] ??Positive?????[] ??Negative ???[x] ??Not Tested  Apprehension/Relocation???[] ??Positive?????[] ??Negative ???[x] ??Not Tested  Anterior/posterior drawer: ??[] ???1+??[] ???2+??[] ???3+??[x] ???Not Tested  ?  NEUROLOGIC: No gross motor or sensory deficits  ?  VASCULAR: Radial pulses are intact  ?  SKIN: No rashes or lesions    IMAGING STUDIES:   RADIOGRAPHS: Radiographs of the right shoulder, 4 views including AP, grashey, outlet, and axillary views were completed in February 2023. They show no fractures or dislocations.  Glenohumeral joint space is well maintained.    MRI of the right shoulder completed at [facility] on December 04, 2021:     ?    IMPRESSION:   Right shoulder full-thickness rotator cuff tendon tear    PLAN:  Abduction sling and cold therapy unit ordered to be dispensed    1. Follow up visit scheduled.  2. Pre-operative laboratory work up.   3. Post-operative pain medications.  Percocet 5/325 milligrams prescribed. The Cures database was reviewed  4. Post-operative instructions given.    I have discussed the diagnosis and proposed procedure with the patient.  In lay terms, I have provided them with all the information necessary to make an informed decision.  In particular we discussed proposed benefit of the operation, as well as the significant risks.  We also discussed the available alternative treatments and risks of these alternatives.  I informed them the level of the severity of the injury and the prognosis for the recovery.  We also discussed the proposed plan for rehabilitation.  They understand that additional surgery may be necessary to solve this problem if they consent to the procedure.        The patient was examined and evaluated by Weldon Picking, PA-C for Hope Budds M.D. The evaluation and plan was reviewed and approved by Georgiann Mccoy.D  _________________________________  Weldon Picking, PA-C  Hope Budds M.D.  ORTHOPEDIC SURGERY          _________________________________  Shawnie Dapper. Nile Riggs, M.D.  Orthopedic Surgery  Sports Medicine/Shoulder and Elbow Reconstruction        [] No interval changes in the H & P        CC: Melene Muller, MD

## 2022-02-11 MED ORDER — OXYCODONE-ACETAMINOPHEN 5-325 MG PO TABS
ORAL_TABLET | 0 refills | Status: AC
Start: 2022-02-11 — End: 2022-02-16

## 2022-02-16 MED ORDER — OXYCODONE-ACETAMINOPHEN 5-325 MG PO TABS
ORAL_TABLET | 0 refills | Status: AC
Start: 2022-02-16 — End: 2022-02-25

## 2022-02-22 NOTE — Progress Notes
DATE: 02/24/2022   NAME: Vicki Cortez   MRN: [id number]   DOB: 1961-01-31   AGE: 61 y.o.       TODD A. SHAPIRO, MD    REASON FOR VISIT:  Post-Op visit #1.    INTERIM HISTORY:  Vicki Cortez returns for the first post-op visit status post right shoulder arthroscopy with rotator cuff repair and biceps tenodesis performed on February 18, 2022.  The patient is tolerating the pain well.  There are no symptoms of infection. ***    PHYSICAL EXAMINATION:    GENERAL: Alert, oriented, in no acute distress    PSYCH: Normal affect and mood    EENT: External exam of the eyes, ears, nose and mouth reveals no deformities, scars or lesions    PULMONARY: Respirations are regular and unlabored. Respiratory effort is normal with no evidence of intercostal retraction, or excessive use of accessory muscles    MUSCULOSKELETAL:  Right Shoulder:    Inspection: The patient is wearing the abduction sling.  Surgical incisions are healing nicely, steri-strips are intact.   There is no drainage or erythemia.  Mild swelling is present. Bruising is present    Palpation: No abnormal warmth or fluctuance    Range of Motion Exam: Not tested.       VASCULAR:  Radial pulse is 2+.      NEUROLOGIC:  There are no gross motor or sensory deficits in the upper extremity.    RADIOGRAPHS: Radiographs of the right shoulder, 3 views including AP, acromioclavicular joint, and outlet views were ordered, performed, and reviewed in clinic today. They show ***    IMPRESSION: Doing well following right shoulder arthroscopy with rotator cuff repair and biceps tenodesis    MEDICAL DECISION MAKING: Radiographs were reviewed as stated above.  The surgery was discussed in detail including surgical photos and any unexpected findings. The patient was instructed to continue use of ice, heat, and medications as necessary.  They are to remove steri-strips in 4-5 days.      The patient will:    []  Begin physical therapy per protocol.   []  Discontinue the sling.    []  Continue with the sling for comfort.   []  Work on [employer] exercises at home.   []  Medications were refilled.   []  Medications were not refilled.     All of the patient?s questions and concerns were addressed and answered.      FOLLOW UP: The patient will return in ***         Sincerely,       _____________________   Shawnie Dapper. Nile Riggs, M.D.  Sports Medicine/Shoulder and Elbow Reconstruction  Orthopedic Surgery        Cc: Melene Muller., MD

## 2022-02-24 ENCOUNTER — Ambulatory Visit: Payer: BLUE CROSS/BLUE SHIELD | Attending: Sports Medicine

## 2022-02-24 DIAGNOSIS — M75121 Complete rotator cuff tear or rupture of right shoulder, not specified as traumatic: Secondary | ICD-10-CM

## 2022-02-24 DIAGNOSIS — Z9889 Other specified postprocedural states: Secondary | ICD-10-CM

## 2022-02-24 DIAGNOSIS — M25511 Pain in right shoulder: Secondary | ICD-10-CM

## 2022-02-24 MED ORDER — OXYCODONE-ACETAMINOPHEN 5-325 MG PO TABS
ORAL_TABLET | 0 refills | Status: AC
Start: 2022-02-24 — End: ?

## 2022-02-25 ENCOUNTER — Telehealth: Payer: BLUE CROSS/BLUE SHIELD

## 2022-02-25 NOTE — Telephone Encounter
Spoke with patient regarding post op PT. She states that [facility] advised her they couldn't get her in until the end of the month. I faxed the referral to [facility] requesting to get the patient in sooner rather than later. Patient was instructed to call there office to schedule.

## 2022-04-21 NOTE — Progress Notes
DATE: 04/22/2022   NAME: Vicki Cortez   MRN: [id number]   DOB: [birth year]   AGE: 61 y.o.       REASON FOR VISIT:  Post-Op visit #2.     INTERIM HISTORY:  Vicki Cortez returns for the first post-op visit status post right shoulder arthroscopy with rotator cuff repair and biceps tenodesis performed on February 18, 2022.  The patient is tolerating the pain well.  There are no symptoms of infection.  ***    PHYSICAL EXAMINATION:    GENERAL: Alert, oriented, in no acute distress    PSYCH: Normal affect and mood    EENT: External exam of the eyes, ears, nose and mouth reveals no deformities, scars or lesions    PULMONARY: Respirations are regular and unlabored. Respiratory effort is normal with no evidence of intercostal retraction, or excessive use of accessory muscles    MUSCULOSKELETAL:  Right Shoulder:    Inspection: The patient is wearing the abduction sling.  Surgical incisions are healing nicely, steri-strips are intact.   There is no drainage or erythemia.  Mild swelling is present. Bruising is present    Palpation: No abnormal warmth or fluctuance    Range of Motion Exam: Not tested.       VASCULAR:  Radial pulse is 2+.      NEUROLOGIC:  There are no gross motor or sensory deficits in the upper extremity.    RADIOGRAPHS: No new images taken today.     IMPRESSION: Doing well following right shoulder arthroscopy with rotator cuff repair and biceps tenodesis    MEDICAL DECISION MAKING: ***    All of the patient?s questions and concerns were addressed and answered.      FOLLOW UP: The patient will return in ***        Sincerely,       _____________________   Shawnie Dapper. Nile Riggs, M.D.  Sports Medicine/Shoulder and Elbow Reconstruction  Orthopedic Surgery        Cc: Melene Muller., MD

## 2022-04-22 ENCOUNTER — Ambulatory Visit: Payer: BLUE CROSS/BLUE SHIELD | Attending: Surgical

## 2022-04-22 DIAGNOSIS — Z9889 Other specified postprocedural states: Secondary | ICD-10-CM

## 2022-06-22 NOTE — Progress Notes
DATE: 06/24/2022   NAME: Vicki Cortez   MRN: [id number]   DOB: 04-Sep-1961   AGE: 61 y.o.       REASON FOR VISIT:  Follow up right shoulder.     INTERIM HISTORY:  Vicki Cortez returns for a follow up and re-evaluation of the right shoulder. She is status post right shoulder arthroscopy with rotator cuff repair and biceps tenodesis performed on February 18, 2022.  She has been attending therapy.  She is managing her symptoms well.     PHYSICAL EXAMINATION:    GENERAL: Alert, oriented, in no acute distress    PSYCH: Normal affect and mood    EENT: External exam of the eyes, ears, nose and mouth reveals no deformities, scars or lesions    PULMONARY: Respirations are regular and unlabored. Respiratory effort is normal with no evidence of intercostal retraction, or excessive use of accessory muscles    MUSCULOSKELETAL:  Right Shoulder:    Inspection: Surgical incisions are healed    Palpation: No abnormal warmth or fluctuance    Range of motion exam:   Forward flexion  170?   Abduction 170?  External rotation:  40?   Internal rotation:  SIJ       VASCULAR:  Radial pulse is 2+.      NEUROLOGIC:  There are no gross motor or sensory deficits in the upper extremity.    RADIOGRAPHS: No new images taken today.     IMPRESSION:   Status post right shoulder arthroscopy with rotator cuff repair and biceps tenodesis    MEDICAL DECISION MAKING:  She is doing well.  She will continue with physical therapy and her home exercises.  She will continue with the strengthening exercises.    All of the patient?s questions and concerns were addressed and answered.      FOLLOW UP: The patient will return in 2 months        Sincerely,       _________________________________  Weldon Picking, PA-C  Hope Budds M.D.  ORTHOPEDIC SURGERY              Cc: Melene Muller., MD

## 2022-06-24 ENCOUNTER — Ambulatory Visit: Payer: BLUE CROSS/BLUE SHIELD | Attending: Surgical

## 2022-06-24 DIAGNOSIS — Z9889 Other specified postprocedural states: Secondary | ICD-10-CM

## 2022-08-20 NOTE — Progress Notes
DATE: 08/24/2022   NAME: Vicki Cortez   MRN: [id number]   DOB: 1961-03-27   AGE: 61 y.o.       REASON FOR VISIT:  Follow up right shoulder.     INTERIM HISTORY:  Vicki Cortez returns for a follow up and re-evaluation of the right shoulder. She is status post right shoulder arthroscopy with rotator cuff repair and biceps tenodesis performed on February 18, 2022.  She has been attending therapy. ***    PHYSICAL EXAMINATION:    GENERAL: Alert, oriented, in no acute distress    PSYCH: Normal affect and mood    EENT: External exam of the eyes, ears, nose and mouth reveals no deformities, scars or lesions    PULMONARY: Respirations are regular and unlabored. Respiratory effort is normal with no evidence of intercostal retraction, or excessive use of accessory muscles    MUSCULOSKELETAL:  Right Shoulder:    Inspection: Surgical incisions are healed    Palpation: No abnormal warmth or fluctuance    Range of motion exam:   Forward flexion  170?   Abduction 170?  External rotation:  40?   Internal rotation:  SIJ       VASCULAR:  Radial pulse is 2+.      NEUROLOGIC:  There are no gross motor or sensory deficits in the upper extremity.    RADIOGRAPHS: No new images taken today.     IMPRESSION:   Status post right shoulder arthroscopy with rotator cuff repair and biceps tenodesis    MEDICAL DECISION MAKING:  ***    All of the patient?s questions and concerns were addressed and answered.      FOLLOW UP: The patient will return in ***        Sincerely,       _________________________________  Weldon Picking, PA-C  Hope Budds M.D.  ORTHOPEDIC SURGERY              Cc: Melene Muller., MD

## 2022-08-24 ENCOUNTER — Ambulatory Visit: Payer: BLUE CROSS/BLUE SHIELD | Attending: Surgical

## 2022-08-24 DIAGNOSIS — Z9889 Other specified postprocedural states: Secondary | ICD-10-CM

## 2022-09-27 NOTE — Progress Notes
DATE: 09/28/2022   NAME: Vicki Cortez   MRN: [id number]   DOB: [birth year]   AGE: 62 y.o.       REASON FOR VISIT:  Follow up right shoulder.     INTERIM HISTORY:  Sinia Antosh returns for a follow up and re-evaluation of the right shoulder. She is status post right shoulder arthroscopy with rotator cuff repair and biceps tenodesis performed on February 18, 2022.  She finished therapy and is performing home exercises. She developed increased pain with her home exercises.  She returns today for re-evaluation.  She has increased pain and stiffness.    PHYSICAL EXAMINATION:    GENERAL: Alert, oriented, in no acute distress    PSYCH: Normal affect and mood    EENT: External exam of the eyes, ears, nose and mouth reveals no deformities, scars or lesions    PULMONARY: Respirations are regular and unlabored. Respiratory effort is normal with no evidence of intercostal retraction, or excessive use of accessory muscles    MUSCULOSKELETAL:  Right Shoulder:    Inspection: Surgical incisions are healed    Palpation: No abnormal warmth or fluctuance    Range of motion exam:   Forward flexion  150? with pain  Abduction 150? with pain  External rotation:  40?   Internal rotation:  T12     Strength: []  Not tested secondary to pain  []  Not tested.   External rotation at side:     []  3/5 []  4-/5 [] 4/5 []  4+/5 [x]  5-/5 [x]  with pain   Deltoid:                               []  3/5 []  4-/5 [] 4/5 []  4+/5 [x]  5-/5 [x]  with pain  Subscap (Belly Press):       []  3/5 []  4-/5 [] 4/5 []  4+/5 [x]  5/5 [x]  with pain      VASCULAR:  Radial pulse is 2+.      NEUROLOGIC:  There are no gross motor or sensory deficits in the upper extremity.    RADIOGRAPHS: Radiographs of the right shoulder, 4 views including AP, acromioclavicular joint, outlet, and axillary views were ordered, performed, and reviewed in clinic today. They show no fractures or dislocations      IMPRESSION:   Status post right shoulder arthroscopy with rotator cuff repair and biceps tenodesis    MEDICAL DECISION MAKING:    We discussed the diagnosis and treatment options.  She returns today with increased pain and restrictions in motion.  Her symptoms are worse on today's visit in comparison to her last appointment.  I have recommended a follow-up MRI to evaluate the tendon repair site.  We briefly discussed returning to therapy, injections, or even subsequent surgery.  I have encouraged her to continue with her home exercises in the meantime.  She will continue with ice, heat, and medication as needed    All of the patient?s questions and concerns were addressed and answered.      FOLLOW UP: The patient will return to the office with the MRI results of the right shoulder, sooner prn        Sincerely,       _________________________________  Weldon Picking, PA-C  Hope Budds M.D.  ORTHOPEDIC SURGERY              Cc: Melene Muller., MD

## 2022-09-28 ENCOUNTER — Ambulatory Visit: Payer: BLUE CROSS/BLUE SHIELD | Attending: Surgical

## 2022-09-28 DIAGNOSIS — Z9889 Other specified postprocedural states: Secondary | ICD-10-CM

## 2022-09-28 DIAGNOSIS — M25511 Pain in right shoulder: Secondary | ICD-10-CM

## 2022-09-28 DIAGNOSIS — G8929 Other chronic pain: Secondary | ICD-10-CM

## 2022-11-08 NOTE — Progress Notes
DATE: 11/09/2022   NAME: Vicki Cortez   MRN: [id number]   DOB: 01/03/1961   AGE: 62 y.o.       REASON FOR VISIT:  Follow up right shoulder.     INTERIM HISTORY:  Vicki Cortez returns for a follow up and re-evaluation of the right shoulder. She is status post right shoulder arthroscopy with rotator cuff repair and biceps tenodesis performed on February 18, 2022.  She finished therapy and is performing her home exercises.  She continues to feel her symptoms are improving.  She has increased pain with repetitive use.  The patient recently underwent an MRI and presents today to review the results.     PHYSICAL EXAMINATION:    GENERAL: Alert, oriented, in no acute distress    PSYCH: Normal affect and mood    EENT: External exam of the eyes, ears, nose and mouth reveals no deformities, scars or lesions    PULMONARY: Respirations are regular and unlabored. Respiratory effort is normal with no evidence of intercostal retraction, or excessive use of accessory muscles    MUSCULOSKELETAL:  Right Shoulder:    Inspection: Surgical incisions are healed    Palpation: No abnormal warmth or fluctuance    Range of motion exam:   Forward flexion  170? with pain  Abduction 170? with pain  External rotation:  40?   Internal rotation:  T12     Strength: []  Not tested secondary to pain  []  Not tested.   External rotation at side:     []  3/5 []  4-/5 [] 4/5 []  4+/5 [x]  5-/5 [x]  with pain   Deltoid:                               []  3/5 []  4-/5 [] 4/5 []  4+/5 [x]  5-/5 [x]  with pain  Subscap (Belly Press):       []  3/5 []  4-/5 [] 4/5 []  4+/5 [x]  5/5 [x]  with pain      VASCULAR:  Radial pulse is 2+.      NEUROLOGIC:  There are no gross motor or sensory deficits in the upper extremity.    RADIOGRAPHS: No new images taken today.    MRI: An MRI study of the right shoulder without contrast performed at [facility] on October 07, 2022 was reviewed in clinic today. Study shows:  1. Surgical changes of supraspinatus tendon repair, construct intact with moderate residual tendinosis.  2. Long head biceps tendon is intact within the bicipital groove, the intra-articular portion is not well visualized. Given  the truncation of the superior labrum, this probably represents changes from an intact biceps tenodesis.  3. Moderate subscapularis tendinosis with low-grade articular surface partial tear of superior insertion.  4. Moderate infraspinatus tendinosis.  5. Small joint effusion and small amount of subacromial/subdeltoid bursal fluid.    The images were reviewed in the office today.  There is small metallic artifact noted closer to the surgical neck from surgery.  This is not noted on x-rays    IMPRESSION:     Status post right shoulder arthroscopy with rotator cuff repair and biceps tenodesis    MEDICAL DECISION MAKING:   Overall the rotator cuff tendon remains intact.  I reviewed the MRI as well as the report and examined the patient.  Her range of motion has continued to improve.  Her symptoms are consistent with tendinopathy.  She would like to try an ultrasound-guided subacromial injection in hopes to alleviate  her symptoms.  I stressed the importance of continuation of her exercises in order to improve her strength and overall function.    She will continue with ice, heat, and medication as needed.    All of the patient?s questions and concerns were addressed and answered.      FOLLOW UP: The patient will return to the office in 2-3 months, sooner prn        Large Joint/Bursa Drain/Inject: R subacromial bursa    Date/Time: 11/09/2022 10:10 AM    Performed by: Maylon Cos., PA-C  Authorized by: Maylon Cos., PA-C    Consent Given by:  Patient  Timeout: prior to procedure the correct patient, procedure, and site was verified    Indications:  Pain  Location:  Shoulder  Site:  R subacromial bursa  Prep: patient was prepped and draped in usual sterile fashion    Approach:  Anterolateral  Guidance: ultrasound    Needle Size:  22 G  Medications:  40 mg triamcinolone acetonide 40 mg/mL; 5 mL lidocaine 1%  Patient tolerance:  Patient tolerated the procedure well with no immediate complications   PROCEDURE: Right shoulder injection    After adequate informed consent was obtained, a SonoSite M-Turbo Ultrasound System laptop unit with a variable frequency (5.0-10.0 MHz) real time imaging linear transducer was used to locate the right shoulder subacromial space. Using sterile technique a needle was guided into the subacromial space. The space was then injected with 5cc of 1% lidocaine, and 1cc of Kenalog 40 mg/ml. Care was taken to ensure the tendon was not injected. The needle was removed and the injection site was cleaned and dressed with a sterile dressing.    The patient tolerated the procedure well. No complications were encountered during the procedure.     The patient was instructed to rest and ice the shoulder as needed.      If any signs or symptoms of a reaction to the mediation or infection develop, the patient should call the office immediately.    Sincerely,       _________________________________  Weldon Picking, PA-C  Hope Budds M.D.  ORTHOPEDIC SURGERY      Cc: Melene Muller., MD

## 2022-11-09 ENCOUNTER — Ambulatory Visit: Payer: BLUE CROSS/BLUE SHIELD | Attending: Surgical

## 2022-11-09 DIAGNOSIS — Z9889 Other specified postprocedural states: Secondary | ICD-10-CM

## 2022-11-09 DIAGNOSIS — M25511 Pain in right shoulder: Secondary | ICD-10-CM

## 2022-11-09 MED ADMIN — TRIAMCINOLONE ACETONIDE 40 MG/ML IJ SUSP: INTRA_ARTICULAR | @ 20:00:00 | Stop: 2022-11-09

## 2022-11-09 MED ADMIN — LIDOCAINE HCL 1 % IJ SOLN: INTRA_ARTICULAR | @ 20:00:00 | Stop: 2022-11-09

## 2022-11-23 ENCOUNTER — Ambulatory Visit: Payer: BLUE CROSS/BLUE SHIELD | Attending: Surgical

## 2023-02-07 ENCOUNTER — Ambulatory Visit: Payer: BLUE CROSS/BLUE SHIELD | Attending: Surgical

## 2023-02-07 DIAGNOSIS — M25511 Pain in right shoulder: Secondary | ICD-10-CM

## 2023-02-07 DIAGNOSIS — Z9889 Other specified postprocedural states: Secondary | ICD-10-CM

## 2023-02-07 NOTE — Progress Notes
DATE: 02/07/2023   NAME: Vicki Cortez   MRN: [id number]   DOB: 1961/07/09   AGE: 62 y.o.       REASON FOR VISIT:  Follow up right shoulder.     INTERIM HISTORY:  Vicki Cortez returns for a follow up and re-evaluation of the right shoulder. She is status post right shoulder arthroscopy with rotator cuff repair and biceps tenodesis performed on February 18, 2022.  She finished therapy and is performing her home exercises. She received a subacromial injection November 09, 2022. She is overall better.       PHYSICAL EXAMINATION:    GENERAL: Alert, oriented, in no acute distress    PSYCH: Normal affect and mood    EENT: External exam of the eyes, ears, nose and mouth reveals no deformities, scars or lesions    PULMONARY: Respirations are regular and unlabored. Respiratory effort is normal with no evidence of intercostal retraction, or excessive use of accessory muscles    MUSCULOSKELETAL:  Right Shoulder:    Inspection: Surgical incisions are healed    Palpation: No abnormal warmth or fluctuance    Range of motion exam:   Forward flexion  170?   Abduction 170?   External rotation:  50?   Internal rotation:  T12     Strength: []  Not tested secondary to pain  []  Not tested.   External rotation at side:     []  3/5 []  4-/5 [] 4/5 []  4+/5 [x]  5-/5 []  with pain   Deltoid:                               []  3/5 []  4-/5 [] 4/5 []  4+/5 [x]  5/5 []  with pain  Subscap (Belly Press):       []  3/5 []  4-/5 [] 4/5 []  4+/5 [x]  5/5 []  with pain      VASCULAR:  Radial pulse is 2+.      NEUROLOGIC:  There are no gross motor or sensory deficits in the upper extremity.    RADIOGRAPHS: No new images taken today.    MRI: An MRI study of the right shoulder without contrast performed at [facility] on October 07, 2022 was reviewed in clinic today. Study shows:  1. Surgical changes of supraspinatus tendon repair, construct intact with moderate residual tendinosis.  2. Long head biceps tendon is intact within the bicipital groove, the intra-articular portion is not well visualized. Given  the truncation of the superior labrum, this probably represents changes from an intact biceps tenodesis.  3. Moderate subscapularis tendinosis with low-grade articular surface partial tear of superior insertion.  4. Moderate infraspinatus tendinosis.  5. Small joint effusion and small amount of subacromial/subdeltoid bursal fluid.    The images were reviewed in the office today.  There is small metallic artifact noted closer to the surgical neck from surgery.  This is not noted on x-rays    IMPRESSION:     Status post right shoulder arthroscopy with rotator cuff repair and biceps tenodesis    MEDICAL DECISION MAKING:  We discussed the diagnosis and treatment options.  She has well-preserved range of motion and strength.  She has no complaints of pain on today's visit.  She responded well to the subacromial injection.  I have encouraged her to continue with her home exercises.  She may progress activity as tolerated.  She will contact our office if she has any worsening symptoms.     She will continue  with ice, heat, and medication as needed.    All of the patient?s questions and concerns were addressed and answered.      FOLLOW UP: The patient will return to the office as needed           Sincerely,       _________________________________  Weldon Picking, PA-C  Hope Budds M.D.  ORTHOPEDIC SURGERY      Cc: Melene Muller., MD

## 2023-02-07 NOTE — Progress Notes
DATE: 02/08/2023   NAME: Vicki Cortez   MRN: [id number]   DOB: [birth year]   AGE: 62 y.o.       REASON FOR VISIT:  Follow up right shoulder.     INTERIM HISTORY:  Vicki Cortez returns for a follow up and re-evaluation of the right shoulder. She is status post right shoulder arthroscopy with rotator cuff repair and biceps tenodesis performed on February 18, 2022.  She finished therapy and is performing her home exercises. The patient received a right subacromial cortisone injection on November 09, 2022. The patient reports {Blank single:19197::''mild'',''moderate'',''significant'',''temporary'',''no'',''***''} relief to their symptoms following the subacromial cortisone injection. ***    PHYSICAL EXAMINATION:    GENERAL: Alert, oriented, in no acute distress    PSYCH: Normal affect and mood    EENT: External exam of the eyes, ears, nose and mouth reveals no deformities, scars or lesions    PULMONARY: Respirations are regular and unlabored. Respiratory effort is normal with no evidence of intercostal retraction, or excessive use of accessory muscles    MUSCULOSKELETAL:  Right Shoulder:    Inspection: Surgical incisions are healed    Palpation: No abnormal warmth or fluctuance    Range of motion exam:   Forward flexion  170? with pain  Abduction 170? with pain  External rotation:  40?   Internal rotation:  T12     Strength: []  Not tested secondary to pain  []  Not tested.   External rotation at side:     []  3/5 []  4-/5 [] 4/5 []  4+/5 [x]  5-/5 [x]  with pain   Deltoid:                               []  3/5 []  4-/5 [] 4/5 []  4+/5 [x]  5-/5 [x]  with pain  Subscap [facility]):       []  3/5 []  4-/5 [] 4/5 []  4+/5 [x]  5/5 [x]  with pain      VASCULAR:  Radial pulse is 2+.      NEUROLOGIC:  There are no gross motor or sensory deficits in the upper extremity.    RADIOGRAPHS: No new images taken today.    MRI: An MRI study of the right shoulder without contrast performed at [facility] on October 07, 2022 was reviewed in clinic today. Study shows:  1. Surgical changes of supraspinatus tendon repair, construct intact with moderate residual tendinosis.  2. Long head biceps tendon is intact within the bicipital groove, the intra-articular portion is not well visualized. Given  the truncation of the superior labrum, this probably represents changes from an intact biceps tenodesis.  3. Moderate subscapularis tendinosis with low-grade articular surface partial tear of superior insertion.  4. Moderate infraspinatus tendinosis.  5. Small joint effusion and small amount of subacromial/subdeltoid bursal fluid.    The images were reviewed in the office today.  There is small metallic artifact noted closer to the surgical neck from surgery.  This is not noted on x-rays    IMPRESSION:     Status post right shoulder arthroscopy with rotator cuff repair and biceps tenodesis    MEDICAL DECISION MAKING:   The patient reported {Blank single:19197::''no'',''minimal'',''mild'',''great'',''temporary'',''near complete'',''complete''} relief in her symptoms following the cortisone injection in the right shoulder. ***     She will continue with ice, heat, and medication as needed.    All of the patient?s questions and concerns were addressed and answered.      FOLLOW UP: The patient  will return to the office in ***, sooner prn        Sincerely,       _________________________________  Weldon Picking, PA-C  Hope Budds M.D.  ORTHOPEDIC SURGERY      Cc: Melene Muller., MD

## 2023-02-08 ENCOUNTER — Ambulatory Visit: Payer: BLUE CROSS/BLUE SHIELD | Attending: Surgical

## 2024-01-24 IMAGING — MR MRI KNEE LT WO CONTRAST
5 of 6 series · 33 of 40 positions shown · non-contrast
Comparison: None.

INDICATION: Pain in left knee. Per patient, lateral knee pain, states pulled hamstring/knee 2 months ago.
TECHNIQUE: Multiplanar, multiecho imaging of the left knee was performed, including T1-weighted and fluid sensitive sequences without intravenous contrast administration.

[Series 2: t2_axial_fs · axial · 4.0mm · 0.44mm/px · z∈[-41,+74]mm · 7 of 24 slices shown]
[im 1/24]
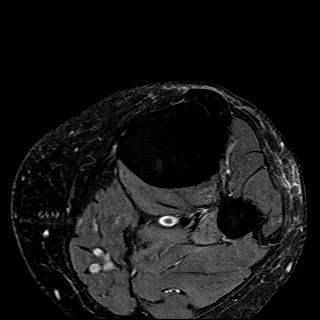
[im 4/24]
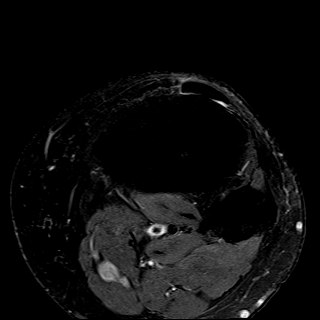
[im 8/24]
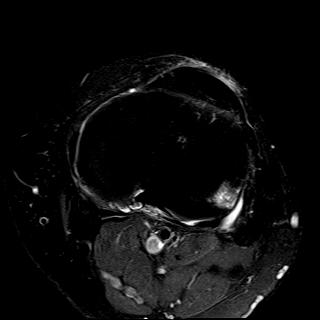
[im 12/24]
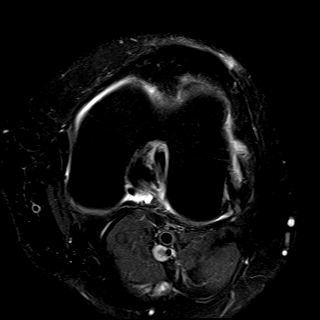
[im 16/24]
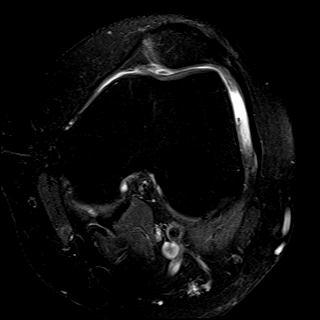
[im 20/24]
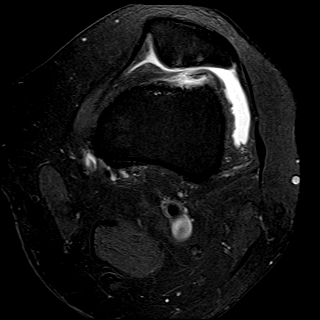
[im 24/24]
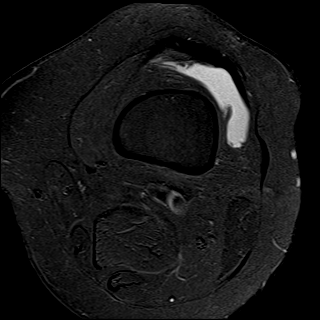

[Series 4: pd_sag_fs · sagittal · 3.0mm · 0.57mm/px · 7 of 25 slices shown]
[im 1/25]
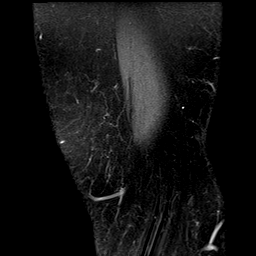
[im 5/25]
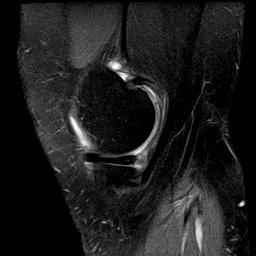
[im 9/25]
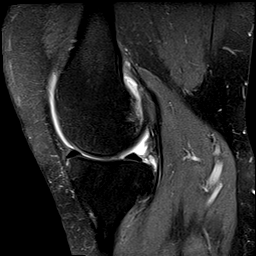
[im 13/25]
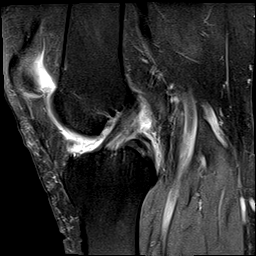
[im 17/25]
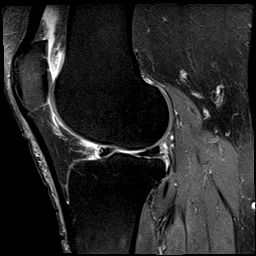
[im 21/25]
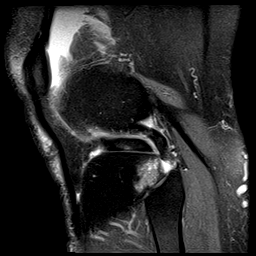
[im 25/25]
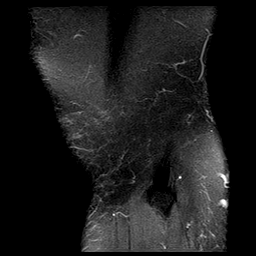

[Series 5: t2_sag_fs · sagittal · 3.0mm · 0.57mm/px · 7 of 25 slices shown]
[im 1/25]
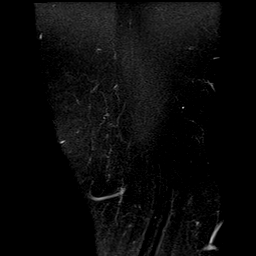
[im 5/25]
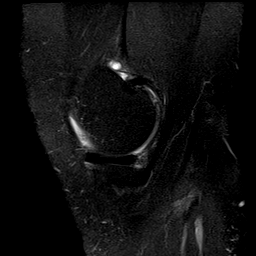
[im 9/25]
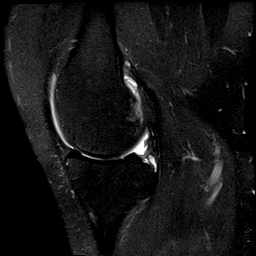
[im 13/25]
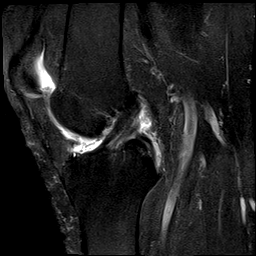
[im 17/25]
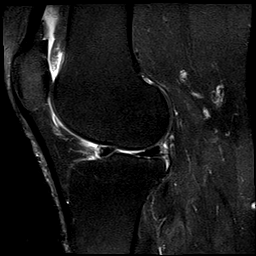
[im 21/25]
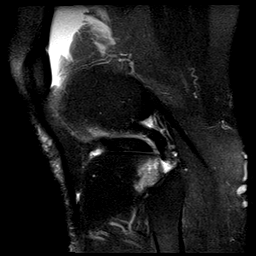
[im 25/25]
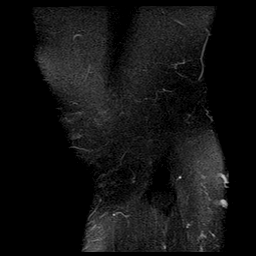

[Series 6: t1_cor · coronal · 4.0mm · 0.44mm/px · 6 of 22 slices shown]
[im 1/22]
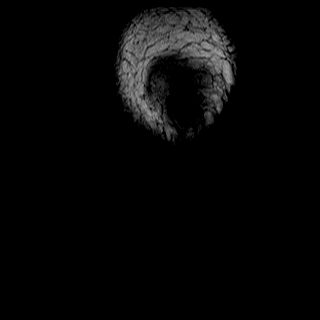
[im 5/22]
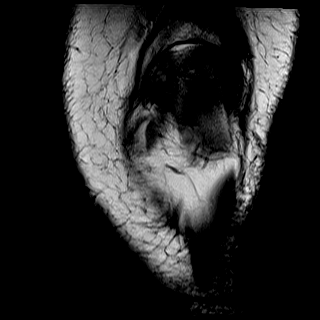
[im 9/22]
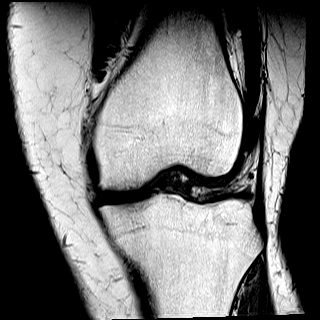
[im 13/22]
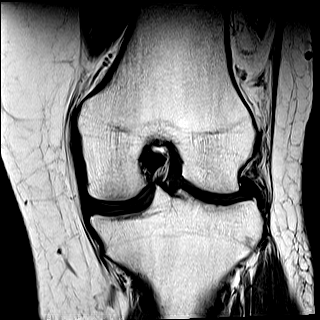
[im 17/22]
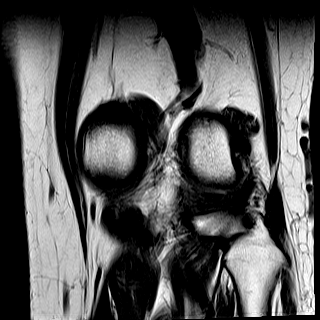
[im 22/22]
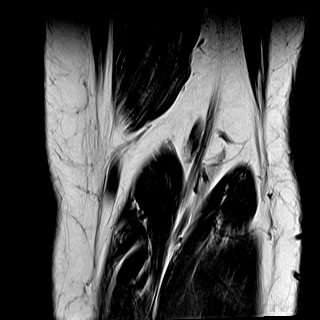

[Series 7: t2_cor_fs · coronal · 4.0mm · 0.44mm/px · 6 of 22 slices shown]
[im 1/22]
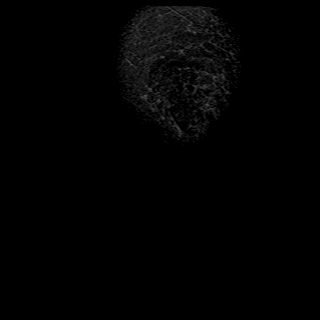
[im 5/22]
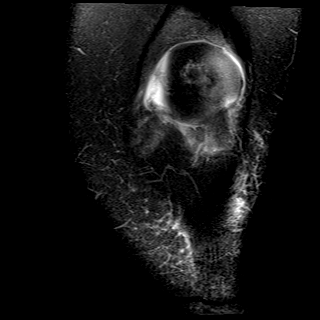
[im 9/22]
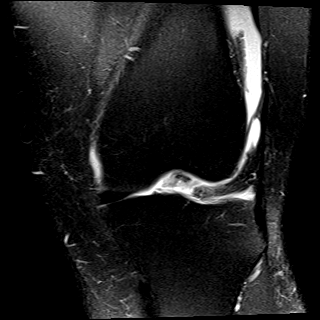
[im 13/22]
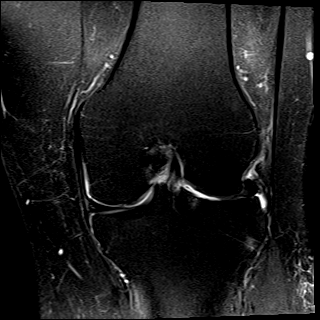
[im 17/22]
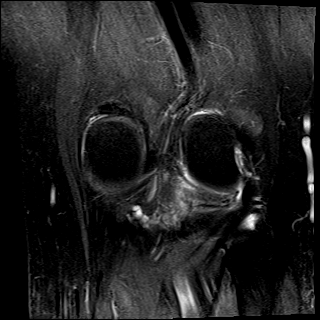
[im 22/22]
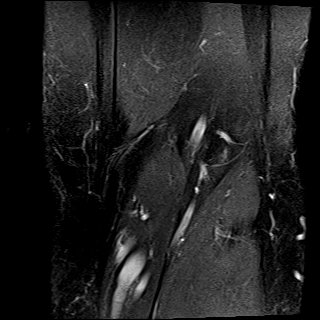

[33 of 40 positions shown; findings below may reference images not displayed]

FINDINGS: MEDIAL MENISCUS:  Intact.

LATERAL MENISCUS:  Complex tear posterior horn and body demonstrated both horizontal and radial components. Body is moderately extruded.

ACL:  Intact.

PCL:  Intact.

MCL:  Intact.

LATERAL LIGAMENTS AND TENDONS:  Intact.

EXTENSOR MECHANISM:  The quadriceps and patellar tendons are intact.

FAT PADS:   Normal.

CARTILAGE:  

Patellofemoral compartment:  Moderate chondral surface irregularity. Near full-thickness chondral defects along the median ridge of the patella. Associated reactive subcortical marrow edema.

Medial compartment:  Mild chondral surface irregularity.

Lateral compartment: Moderate chondral surface irregularity with associated reactive subcortical marrow edema.

BONE MARROW: No visible acute fracture or destructive process.

Moderate joint effusion.

No cystic or solid masses.

No muscle atrophy or denervation edema.
IMPRESSION: 1.
Complex tear of the posterior horn and body lateral meniscus, with extrusion of the body into the gutter.

2.
Moderate patellofemoral and lateral compartment chondral abnormalities.

3.
Moderate joint effusion.
